# Patient Record
Sex: Female | Born: 1980 | Race: Asian | Hispanic: No | Marital: Married | Smoking: Never smoker
Health system: Southern US, Community
[De-identification: ages and names within clinical notes are randomized; demographics above are authoritative.]

## PROBLEM LIST (undated history)

## (undated) DIAGNOSIS — D649 Anemia, unspecified: Secondary | ICD-10-CM

## (undated) DIAGNOSIS — E079 Disorder of thyroid, unspecified: Secondary | ICD-10-CM

## (undated) DIAGNOSIS — E282 Polycystic ovarian syndrome: Secondary | ICD-10-CM

## (undated) HISTORY — DX: Disorder of thyroid, unspecified: E07.9

## (undated) HISTORY — DX: Polycystic ovarian syndrome: E28.2

## (undated) HISTORY — PX: GASTRIC BYPASS: SHX52

## (undated) HISTORY — DX: Anemia, unspecified: D64.9

---

## 2011-05-28 ENCOUNTER — Encounter: Payer: Self-pay | Admitting: Obstetrics & Gynecology

## 2011-07-01 ENCOUNTER — Encounter: Payer: Self-pay | Admitting: Obstetrics & Gynecology

## 2011-09-23 ENCOUNTER — Encounter: Payer: Self-pay | Admitting: Obstetrics & Gynecology

## 2011-10-28 ENCOUNTER — Encounter: Payer: Self-pay | Admitting: Obstetrics & Gynecology

## 2011-12-17 ENCOUNTER — Encounter: Payer: Self-pay | Admitting: Obstetrics & Gynecology

## 2011-12-24 ENCOUNTER — Encounter: Payer: Self-pay | Admitting: Obstetrics & Gynecology

## 2011-12-24 ENCOUNTER — Ambulatory Visit (INDEPENDENT_AMBULATORY_CARE_PROVIDER_SITE_OTHER): Payer: Self-pay | Admitting: Obstetrics & Gynecology

## 2011-12-24 VITALS — BP 121/76 | HR 105 | Temp 98.5°F | Ht 63.0 in | Wt 225.2 lb

## 2011-12-24 DIAGNOSIS — E282 Polycystic ovarian syndrome: Secondary | ICD-10-CM

## 2011-12-24 DIAGNOSIS — Z0000 Encounter for general adult medical examination without abnormal findings: Secondary | ICD-10-CM

## 2011-12-24 DIAGNOSIS — Z01419 Encounter for gynecological examination (general) (routine) without abnormal findings: Secondary | ICD-10-CM

## 2011-12-24 MED ORDER — METFORMIN HCL 1000 MG PO TABS: 1000.0000 mg | ORAL_TABLET | Freq: Two times a day (BID) | ORAL | Status: DC

## 2011-12-24 MED ORDER — MEDROXYPROGESTERONE ACETATE 10 MG PO TABS: ORAL_TABLET | ORAL | Status: DC

## 2011-12-24 NOTE — Progress Notes (Signed)
  Subjective:    Patient ID: Olivia Bowman, female    DOB: 09-05-1980, 31 y.o.   MRN: [id number]  HPI 31 yo lady who is here today with oligomenorrhea. She was diagnosed with PCOS in 2010 and started on metformin. She has hypothyroid as well and has gained 25 # in the last 3 years. She and her husband have been trying to conceive for 4 years.   Review of Systems Her pap was done about 2 years ago    Objective:   Physical Exam  Nulliparous cervix, NSSA, NT, no adnexal masses      Assessment & Plan:  PCOS and oligomenorrhea- recommend increase metformin to 1 gram po BID I will check fasting labs at her convenience. Encourage weight loss Provera 10 mg cyclicly monthly

## 2012-01-14 ENCOUNTER — Other Ambulatory Visit: Payer: Self-pay

## 2012-01-21 ENCOUNTER — Other Ambulatory Visit: Payer: Self-pay

## 2012-01-21 DIAGNOSIS — E282 Polycystic ovarian syndrome: Secondary | ICD-10-CM

## 2012-01-21 LAB — LIPID PANEL
Cholesterol: 136 mg/dL (ref 0–200)
HDL: 40 mg/dL (ref >39)
Total CHOL/HDL Ratio: 3.4 Ratio

## 2012-01-22 LAB — COMPLETE METABOLIC PANEL WITH GFR
AST: 15 U/L (ref 0–37)
Albumin: 4 g/dL (ref 3.5–5.2)
Alkaline Phosphatase: 60 U/L (ref 39–117)
Calcium: 9.3 mg/dL (ref 8.4–10.5)
Chloride: 104 mEq/L (ref 96–112)
Potassium: 4 mEq/L (ref 3.5–5.3)
Sodium: 139 mEq/L (ref 135–145)
Total Protein: 6.8 g/dL (ref 6.0–8.3)

## 2012-02-15 ENCOUNTER — Telehealth: Payer: Self-pay | Admitting: Medical

## 2012-02-15 NOTE — Telephone Encounter (Signed)
Returned call to patient. LM to return call to clinic for lab results.

## 2012-02-15 NOTE — Telephone Encounter (Signed)
Returned patient call. LM for patient to return call to clinic.

## 2012-02-15 NOTE — Telephone Encounter (Signed)
Patient returned call to clinic asking for lab results.

## 2012-02-15 NOTE — Telephone Encounter (Signed)
Patient calling requesting lab results.

## 2012-02-15 NOTE — Telephone Encounter (Signed)
Left message for patient that we are returning her call

## 2012-02-16 NOTE — Telephone Encounter (Signed)
Called and left message that this is our sixth attempt in trying to reach you.  If you continue to have any further questions, comments, or concerns to please give [place] a call back.

## 2012-03-03 ENCOUNTER — Telehealth: Payer: Self-pay | Admitting: *Deleted

## 2012-03-03 NOTE — Telephone Encounter (Signed)
Pt left message stating that we had returned her call several times regarding her lab results and she missed our calls. She is now calling again requesting the results. I called and spoke w/pt. I informed her of the tests which had been performed on 01/21/12 and the results. Pt asked if she should continue taking the synthroid. I stated that she should follow the instructions which she was given by the prescribing doctor or contact the doctor if she has questions about that medication. Pt also asked if she was supposed to take the provera if she gets a period on her own during a given month. I advised pt that she should not take the provera unless she does not have a spontaneous period every 4-6 wks. Pt voiced understanding of all information and instructions.

## 2012-06-30 ENCOUNTER — Encounter: Payer: Self-pay | Admitting: Obstetrics and Gynecology

## 2012-06-30 ENCOUNTER — Ambulatory Visit (INDEPENDENT_AMBULATORY_CARE_PROVIDER_SITE_OTHER): Payer: Self-pay | Admitting: Obstetrics and Gynecology

## 2012-06-30 VITALS — BP 113/73 | HR 91 | Temp 98.3°F | Ht 63.0 in | Wt 209.1 lb

## 2012-06-30 DIAGNOSIS — E282 Polycystic ovarian syndrome: Secondary | ICD-10-CM

## 2012-06-30 DIAGNOSIS — Z3009 Encounter for other general counseling and advice on contraception: Secondary | ICD-10-CM

## 2012-06-30 DIAGNOSIS — Z113 Encounter for screening for infections with a predominantly sexual mode of transmission: Secondary | ICD-10-CM

## 2012-06-30 LAB — CBC
MCH: 23.5 pg — ABNORMAL LOW (ref 26.0–34.0)
MCV: 71.4 fL — ABNORMAL LOW (ref 78.0–100.0)
Platelets: 427 10*3/uL — ABNORMAL HIGH (ref 150–400)
RDW: 16.8 % — ABNORMAL HIGH (ref 11.5–15.5)

## 2012-06-30 MED ORDER — LEVOTHYROXINE SODIUM 75 MCG PO TABS: 75.0000 ug | ORAL_TABLET | Freq: Every day | ORAL | Status: DC

## 2012-06-30 MED ORDER — NORGESTIMATE-ETH ESTRADIOL 0.25-35 MG-MCG PO TABS: 1.0000 | ORAL_TABLET | Freq: Every day | ORAL | Status: DC

## 2012-06-30 NOTE — Progress Notes (Signed)
Patient ID: Olivia Bowman, female   DOB: [birth year], 32 y.o.   MRN: [id number] 32 yo G0 presenting today requesting STD screening and initiation of birth control. She also would like her TSH level checked. Patient is in the process of being of obtaining legal separation from husband and desires STD testing. Patient has a primary care physician in [facility] and desires refills in her Synthroid. Informed patient that I will provide her with the refill and draw her labs but she needs to follow up with her PCP for further refill or abnormal results. Patient has never taken birth control pills and denies h/o migraine headaches, personal or family h/o DVT or strokes.  All requested laboratory test were collected Rx Sprintec provided RTC in 3 months for BP check and refills

## 2012-06-30 NOTE — Addendum Note (Signed)
Addended by: Franchot Mimes on: 06/30/2012 04:46 PM   Modules accepted: Orders

## 2012-07-01 LAB — HEPATITIS B SURFACE ANTIBODY,QUALITATIVE: Hep B S Ab: NONREACTIVE

## 2012-07-05 ENCOUNTER — Telehealth: Payer: Self-pay | Admitting: *Deleted

## 2012-07-05 NOTE — Telephone Encounter (Addendum)
Pt left message requesting lab results. I returned her call and informed her of results from all labs done on 06/30/12. Pt asked if she can get copies of her results. I stated that she may come to clinic, sign release of information and then we can give her copies. Pt voiced understanding.

## 2012-08-02 ENCOUNTER — Telehealth: Payer: Self-pay | Admitting: General Practice

## 2012-08-02 DIAGNOSIS — E282 Polycystic ovarian syndrome: Secondary | ICD-10-CM

## 2012-08-02 DIAGNOSIS — Z309 Encounter for contraceptive management, unspecified: Secondary | ICD-10-CM

## 2012-08-02 MED ORDER — NORGESTIMATE-ETH ESTRADIOL 0.25-35 MG-MCG PO TABS: 1.0000 | ORAL_TABLET | Freq: Every day | ORAL | Status: DC

## 2012-08-02 MED ORDER — LEVOTHYROXINE SODIUM 75 MCG PO TABS: 75.0000 ug | ORAL_TABLET | Freq: Every day | ORAL | Status: DC

## 2012-08-02 MED ORDER — METFORMIN HCL 1000 MG PO TABS: 1000.0000 mg | ORAL_TABLET | Freq: Two times a day (BID) | ORAL | Status: DC

## 2012-08-02 NOTE — Telephone Encounter (Signed)
Patient called and left message stating she was calling about a medication sent to [facility] but the medication is $4 at [facility] and would like the medication (birth control pills) to be sent to [facility] instead. Called patient back and informed her that I received her message about wanting her birth control pills sent to [facility] and asked patient what [facility] she wants the medication sent to. Patient stated she wants the birth control pills sent to [facility] in [place] on [address] and would also like her synthroid and metformin sent there as well. Told patient I would send them to [facility] on [address]. Patient verbalized understanding and had no further questions

## 2013-04-07 ENCOUNTER — Other Ambulatory Visit: Payer: Self-pay | Admitting: Obstetrics and Gynecology

## 2013-10-10 ENCOUNTER — Emergency Department: Payer: 59

## 2013-10-10 ENCOUNTER — Emergency Department
Admission: EM | Admit: 2013-10-10 | Discharge: 2013-10-10 | Disposition: A | Discharge status: Home / Self Care | Payer: 59 | Attending: Emergency Medicine | Admitting: Emergency Medicine

## 2013-10-10 DIAGNOSIS — E079 Disorder of thyroid, unspecified: Secondary | ICD-10-CM | POA: Insufficient documentation

## 2013-10-10 DIAGNOSIS — N289 Disorder of kidney and ureter, unspecified: Secondary | ICD-10-CM | POA: Insufficient documentation

## 2013-10-10 DIAGNOSIS — N12 Tubulo-interstitial nephritis, not specified as acute or chronic: Secondary | ICD-10-CM | POA: Insufficient documentation

## 2013-10-10 DIAGNOSIS — E282 Polycystic ovarian syndrome: Secondary | ICD-10-CM | POA: Insufficient documentation

## 2013-10-10 DIAGNOSIS — N2889 Other specified disorders of kidney and ureter: Secondary | ICD-10-CM

## 2013-10-10 LAB — CBC AND DIFFERENTIAL
Basophils Absolute Automated: 0.02 10*3/uL (ref 0.00–0.20)
Basophils Automated: 0 %
Eosinophils Absolute Automated: 0.01 10*3/uL (ref 0.00–0.70)
Eosinophils Automated: 0 %
Hematocrit: 39.8 % (ref 37.0–47.0)
Hgb: 12.9 g/dL (ref 12.0–16.0)
Immature Granulocytes Absolute: 0.04 10*3/uL
Immature Granulocytes: 0 %
Lymphocytes Absolute Automated: 1.3 10*3/uL (ref 0.50–4.40)
Lymphocytes Automated: 9 %
MCH: 26.5 pg — ABNORMAL LOW (ref 28.0–32.0)
MCHC: 32.4 g/dL (ref 32.0–36.0)
MCV: 81.7 fL (ref 80.0–100.0)
MPV: 8.6 fL — ABNORMAL LOW (ref 9.4–12.3)
Monocytes Absolute Automated: 1.64 10*3/uL — ABNORMAL HIGH (ref 0.00–1.20)
Monocytes: 12 %
Neutrophils Absolute: 10.93 10*3/uL — ABNORMAL HIGH (ref 1.80–8.10)
Neutrophils: 79 %
Platelets: 374 10*3/uL (ref 140–400)
RBC: 4.87 10*6/uL (ref 4.20–5.40)
RDW: 14 % (ref 12–15)
WBC: 13.9 10*3/uL — ABNORMAL HIGH (ref 3.50–10.80)

## 2013-10-10 LAB — COMPREHENSIVE METABOLIC PANEL
ALT: 13 U/L (ref 0–55)
AST (SGOT): 16 U/L (ref 5–34)
Albumin/Globulin Ratio: 0.9 (ref 0.9–2.2)
Albumin: 3.6 g/dL (ref 3.5–5.0)
Alkaline Phosphatase: 66 U/L (ref 40–150)
Anion Gap: 13 (ref 5.0–15.0)
BUN: 5.1 mg/dL — ABNORMAL LOW (ref 7.0–19.0)
Bilirubin, Total: 0.3 mg/dL (ref 0.2–1.2)
CO2: 21 mEq/L — ABNORMAL LOW (ref 22–29)
Calcium: 10.1 mg/dL (ref 8.5–10.5)
Chloride: 107 mEq/L (ref 98–107)
Creatinine: 0.8 mg/dL (ref 0.6–1.0)
Globulin: 4.2 g/dL — ABNORMAL HIGH (ref 2.0–3.6)
Glucose: 89 mg/dL (ref 70–100)
Potassium: 3.9 mEq/L (ref 3.5–5.1)
Protein, Total: 7.8 g/dL (ref 6.0–8.3)
Sodium: 141 mEq/L (ref 136–145)

## 2013-10-10 LAB — LACTIC ACID, PLASMA: Lactic Acid: 1 mmol/L (ref 0.7–2.1)

## 2013-10-10 LAB — URINALYSIS
Bilirubin, UA: NEGATIVE
Blood, UA: NEGATIVE
Glucose, UA: NEGATIVE
Leukocyte Esterase, UA: NEGATIVE
Nitrite, UA: NEGATIVE
Specific Gravity UA: 1.013 (ref 1.001–1.035)
Urine pH: 8.5 — AB (ref 5.0–8.0)
Urobilinogen, UA: 0.2 mg/dL (ref 0.2–2.0)

## 2013-10-10 LAB — URINE MICROSCOPIC

## 2013-10-10 LAB — LIPASE: Lipase: 20 U/L (ref 8–78)

## 2013-10-10 LAB — GFR: EGFR: >60

## 2013-10-10 LAB — URINE HCG QUALITATIVE: Urine HCG Qualitative: NEGATIVE

## 2013-10-10 MED ORDER — CIPROFLOXACIN HCL 500 MG PO TABS
500.0000 mg | ORAL_TABLET | Freq: Two times a day (BID) | ORAL | Status: AC
Start: 2013-10-10 — End: 2013-10-17

## 2013-10-10 MED ORDER — KETOROLAC TROMETHAMINE 30 MG/ML IJ SOLN
30.0000 mg | Freq: Once | INTRAMUSCULAR | Status: AC
Start: 2013-10-10 — End: 2013-10-10
  Administered 2013-10-10: 30 mg via INTRAVENOUS
  Filled 2013-10-10: qty 1

## 2013-10-10 MED ORDER — IOHEXOL 240 MG/ML IJ SOLN
50.0000 mL | Freq: Once | INTRAMUSCULAR | Status: DC
Start: 2013-10-10 — End: 2013-10-10

## 2013-10-10 MED ORDER — IODIXANOL 320 MG/ML IV SOLN
100.0000 mL | Freq: Once | INTRAVENOUS | Status: AC | PRN
Start: 2013-10-10 — End: 2013-10-10
  Administered 2013-10-10: 100 mL via INTRAVENOUS

## 2013-10-10 MED ORDER — SODIUM CHLORIDE 0.9 % IV MBP
1.0000 g | Freq: Once | INTRAVENOUS | Status: AC
Start: 2013-10-10 — End: 2013-10-10
  Administered 2013-10-10: 1 g via INTRAVENOUS
  Filled 2013-10-10: qty 1000
  Filled 2013-10-10: qty 50

## 2013-10-10 MED ORDER — OXYCODONE-ACETAMINOPHEN 5-325 MG PO TABS
2.0000 | ORAL_TABLET | Freq: Once | ORAL | Status: AC
Start: 2013-10-10 — End: 2013-10-10
  Administered 2013-10-10: 2 via ORAL
  Filled 2013-10-10: qty 2

## 2013-10-10 MED ORDER — OXYCODONE-ACETAMINOPHEN 5-325 MG PO TABS
ORAL_TABLET | ORAL | Status: DC
Start: 2013-10-10 — End: 2017-10-14

## 2013-10-10 MED ORDER — ONDANSETRON HCL 4 MG/2ML IJ SOLN
4.0000 mg | Freq: Once | INTRAMUSCULAR | Status: AC
Start: 2013-10-10 — End: 2013-10-10
  Administered 2013-10-10: 4 mg via INTRAVENOUS
  Filled 2013-10-10: qty 2

## 2013-10-10 MED ORDER — SODIUM CHLORIDE 0.9 % IV BOLUS
1000.0000 mL | Freq: Once | INTRAVENOUS | Status: AC
Start: 2013-10-10 — End: 2013-10-10
  Administered 2013-10-10: 1000 mL via INTRAVENOUS

## 2013-10-10 NOTE — ED Notes (Signed)
Patient presents to the ER with right upper abd pain and a fever since yesterday. Pain is 5/10, patient did vomit last night. Denies any pain urinating today. Patient drove herself to the ER and denies any recent travel.

## 2013-10-10 NOTE — ED Provider Notes (Signed)
ATTENDING : Helayne Seminole, MD. I HAVE PERSONALLY SEEN AND EXAMINED PATIENT. I have personally obtained a history, performed a focused PE, and participated in the medical decision making of this patient.     I have discussed and agree with the care plan of the Advanced Care Provider.    Brief Note - with pain in abdomen and fever since yesterday.  Patient states she has had some urinary frequency and urgency but denies dysuria at the present time.  She was seen at an urgent care and was found to have an elevated white count and sent in for an evaluation.  On physical examination patient has tenderness in the right upper quadrant, which is moderate in nature and also tenderness along the right flank.  She has no lower abdominal tenderness.  Plan is to check lab work, urine and sonogram of the gallbladder and reassess.          Helayne Seminole, MD  10/10/13 [phone]

## 2013-10-10 NOTE — ED Provider Notes (Signed)
Physician/Midlevel provider first contact with patient: 10/10/13 1817         History     Chief Complaint   Patient presents with   . Abdominal Pain   . Fever     HPI Comments: 33 yo female with c/o ruq abd pain, n/v and fever since yesterday. C/o urine burning several days ago, since resolved.     Patient is a 33 y.o. female presenting with abdominal pain.   Abdominal Pain  The primary symptoms of the illness do not include vaginal discharge. The current episode started yesterday. The onset of the illness was gradual. The problem has been gradually worsening.   The patient states that she believes she is currently not pregnant (denies). Additional symptoms associated with the illness include chills. Symptoms associated with the illness do not include back pain.       Past Medical History   Diagnosis Date   . Disorder of thyroid    . Polycystic ovarian disease        History reviewed. No pertinent past surgical history.    No family history on file.    Social  History   Substance Use Topics   . Smoking status: Never Smoker    . Smokeless tobacco: Not on file   . Alcohol Use: Yes      Comment: rare       .     No Known Allergies    Current/Home Medications    LEVOTHYROXINE (SYNTHROID, LEVOTHROID) 75 MCG TABLET    Take 75 mcg by mouth Once a day at 6:00am.    METFORMIN (GLUCOPHAGE) 500 MG TABLET    Take 500 mg by mouth 2 (two) times daily with meals.    TOPIRAMATE (TOPAMAX) 25 MG TABLET    Take 25 mg by mouth 2 (two) times daily.    UNKNOWN TO PATIENT            Review of Systems   Constitutional: Positive for chills.   Eyes: Negative for discharge and redness.   Cardiovascular: Negative for chest pain and palpitations.   Gastrointestinal: Negative for abdominal distention.   Genitourinary: Negative for vaginal discharge and pelvic pain.   Musculoskeletal: Negative for back pain and neck pain.   Skin: Negative for color change and wound.   Neurological: Negative for dizziness and light-headedness.       Physical Exam     BP: 126/73 mmHg, Heart Rate: 124 , Temp: 101.7 F (38.7 C), Resp Rate: 20 , SpO2: 100 %, Weight: 72.576 kg    Physical Exam   Nursing note and vitals reviewed.  Constitutional: She is oriented to person, place, and time. She appears well-developed and well-nourished. She appears distressed.   HENT:   Head: Normocephalic and atraumatic.   Eyes: Conjunctivae normal are normal. Right eye exhibits no discharge. Left eye exhibits no discharge.   Neck: Normal range of motion. Neck supple.   Cardiovascular: Regular rhythm.         tachy   Pulmonary/Chest: Effort normal and breath sounds normal. No respiratory distress.   Abdominal: Soft. Bowel sounds are normal. She exhibits no distension. There is tenderness (ruq).   Musculoskeletal: Normal range of motion.   Neurological: She is alert and oriented to person, place, and time.   Skin: Skin is warm and dry.   Psychiatric: She has a normal mood and affect. Her behavior is normal.       MDM and ED Course     ED  Medication Orders      Start     Status Ordering Provider    10/10/13 2239   oxyCODONE-acetaminophen (PERCOCET) 5-325 MG per tablet 2 tablet   Once      Comments: To go    Route: Oral  Ordered Dose: 2 tablet         Acknowledged Catricia Scheerer JEAN    10/10/13 2217   cefTRIAXone (ROCEPHIN) 1 g in sodium chloride 0.9 % 50 mL IVPB mini-bag plus   Once      Route: Intravenous  Ordered Dose: 1 g         Last MAR action:  New Bag Tetsuo Coppola JEAN    10/10/13 2021      Once,   Status:  Discontinued      Route: Oral  Ordered Dose: 50 mL         Discontinued Karlo Goeden JEAN    10/10/13 1827   ondansetron (ZOFRAN) injection 4 mg   Once      Route: Intravenous  Ordered Dose: 4 mg         Last MAR action:  Given Meher Kucinski JEAN    10/10/13 1827   ketorolac (TORADOL) injection 30 mg   Once      Route: Intravenous  Ordered Dose: 30 mg         Last MAR action:  Given Brucha Ahlquist JEAN    10/10/13 1819   sodium chloride 0.9 % bolus 1,000 mL   Once      Route: Intravenous  Ordered Dose:  1,000 mL         Last MAR action:  Stopped Alex Leahy JEAN                 MDM  Number of Diagnoses or Management Options  Pyelonephritis:   Renal mass:   Diagnosis management comments: I, Polo Riley PA-C, have been the primary provider for Benny Lennert during this Emergency Dept visit.  Oxygen saturation by pulse oximetry is 95%-100%, Normal.  Interventions: None Needed    Feeling better after meds    Results of ct d/w pt: renal masses - neoplasm vs pyelonephritis. Will tx for pyelo and d/w pt importance of close urology f/u. Recommended immediate er return for any new or worsening problems or concerns. Pt expressed understanding and agreement with [place] plan. Well appearing upon [place].     The attending signature signifies review and agreement of the history, physical examination, evaluation, clinical impression and plan except as noted  Pt seen by Dr.Virmani who is in agreement with plan.        Amount and/or Complexity of Data Reviewed  Clinical lab tests: ordered and reviewed  Tests in the radiology section of CPT: ordered and reviewed          Procedures    Clinical Impression & Disposition     Clinical Impression  Final diagnoses:   Pyelonephritis   Renal mass        ED Disposition     Discharge Kenedie Burbage discharge to home/self care.    Condition at disposition: Stable             New Prescriptions    CIPROFLOXACIN (CIPRO) 500 MG TABLET    Take 1 tablet (500 mg total) by mouth 2 (two) times daily.    OXYCODONE-ACETAMINOPHEN (PERCOCET) 5-325 MG PER TABLET    1-2 tablets by mouth every 4-6 hours as needed for pain;  Do not drive  or operate machinery while taking this medicine                 Rondell Reams, [place]  10/10/13 2248

## 2013-10-10 NOTE — Discharge Instructions (Signed)
Follow up with urology this week regarding the right renal mass versus infection  Return to the ER immediately for any new or worsening problems or concerns    Pyelonephritis    You have been diagnosed with pyelonephritis. This is also known as a kidney infection.    Pyelonephritis is a bacterial infection in the kidneys. Some symptoms are: Fever (temperature higher than 100.5F / 38C), chills, nausea (sick to the stomach) and vomiting (throwing up). Back pain on one or both sides is another symptom. There is sometimes trouble urinating (peeing). You may also feel very weak. The diagnosis is made based on your symptoms and a test of your urine.    Pyelonephritis most happens most often when a lower urinary tract infection (bladder infection) climbs up the urinary tract and gets into the kidney. It is important to treat a bladder infection early. This is to prevent pyelonephritis and possible kidney damage.    Pyelonephritis is treated with antibiotics, pain and fever medicines and fluids. Some patients need an "IV" if they have nausea (sick to the stomach) or vomiting and cannot keep down the antibiotics, or if they aren't getting better after 2-3 days of oral (by mouth) medications. Most pyelonephritis patients do not need to check into the hospital. A few patients who don't do well with the oral (by mouth) antibiotics or get sicker despite treatment may need to get admitted to the hospital.    YOU SHOULD SEEK MEDICAL ATTENTION IMMEDIATELY, EITHER HERE OR AT THE NEAREST EMERGENCY DEPARTMENT, IF ANY OF THE FOLLOWING OCCURS:   Failure to improve after 2-3 days of antibiotics.   Nausea or vomiting develops and you cannot to keep down medicines or fluids.   Increased weakness or lightheadedness.   Any progressive or worsening symptoms or any other concerns develop.         Additional Diagnosis: right renal mass

## 2013-10-10 NOTE — ED Provider Notes (Signed)
ATTENDING : Helayne Seminole, MD. I HAVE PERSONALLY SEEN AND EXAMINED PATIENT. I have personally obtained a history, performed a focused PE, and participated in the medical decision making of this patient.   REVIEW OF MLP CHART - I have reviewed the history, PE, evaluation, clinical impression, and plan and agree.            Helayne Seminole, MD  10/10/13 [phone]

## 2013-10-10 NOTE — ED Notes (Signed)
Initial lab work and blood cultures drawn by [employer] [facility] in triage.

## 2013-10-12 NOTE — ED Provider Notes (Signed)
Received lab notification of gram neg rods in blood cx. Attempted to call pt. No answer. Message left for pt to return call to ED.     Rondell Reams, [place]  10/12/13 1159

## 2013-10-17 ENCOUNTER — Encounter: Payer: Self-pay | Admitting: Physician Assistant

## 2013-10-18 ENCOUNTER — Encounter: Payer: Self-pay | Admitting: Physician Assistant

## 2013-10-18 NOTE — ED Notes (Signed)
Pt called regarding messages about lab results. Informed pt of ecoli and referred back to follow up as instructed in notes. Pt expressed understanding, states she is already in contact with urology who placed her on additional abx

## 2018-02-02 ENCOUNTER — Encounter: Payer: Self-pay | Admitting: Endocrinology

## 2018-02-18 ENCOUNTER — Ambulatory Visit: Payer: Self-pay | Admitting: Endocrinology

## 2018-03-25 ENCOUNTER — Ambulatory Visit (INDEPENDENT_AMBULATORY_CARE_PROVIDER_SITE_OTHER): Payer: 59 | Admitting: Endocrinology

## 2018-03-25 ENCOUNTER — Encounter: Payer: Self-pay | Admitting: Endocrinology

## 2018-03-25 VITALS — BP 104/72 | HR 92 | Ht 63.0 in | Wt 207.0 lb

## 2018-03-25 DIAGNOSIS — E282 Polycystic ovarian syndrome: Secondary | ICD-10-CM

## 2018-03-25 DIAGNOSIS — R635 Abnormal weight gain: Secondary | ICD-10-CM | POA: Diagnosis not present

## 2018-03-25 MED ORDER — SPIRONOLACTONE 25 MG PO TABS: 25.0000 mg | ORAL_TABLET | Freq: Every day | ORAL | 11 refills | Status: DC

## 2018-03-25 MED ORDER — DEXAMETHASONE 1 MG PO TABS: ORAL_TABLET | ORAL | 0 refills | Status: DC

## 2018-03-25 NOTE — Progress Notes (Signed)
Subjective:    Patient ID: Olivia Bowman, female    DOB: [birth year], 37 y.o.   MRN: [id number]  HPI Pt is referred by Dr Katrinka Blazing, for polycystic ovary syndrome.  Pt had menarche at age 37.  She has always had regular menses. She is G0.  She does not want a pregnancy now, but would like to preserve fertility for the future.  She started on oral contraceptives 1 month ago.  She has moderate intermitt cramps in the pelvis, and assoc depression.  She has taken metformin intermittently for many years.  Pt says PCP manages hypothyroidism.   Past Medical History:  Diagnosis Date  . Anemia   . PCOS (polycystic ovarian syndrome)   . Thyroid disease     No past surgical history on file.  Social History   Socioeconomic History  . Marital status: Married    Spouse name: Not on file  . Number of children: Not on file  . Years of education: Not on file  . Highest education level: Not on file  Occupational History  . Not on file  Social Needs  . Financial resource strain: Not on file  . Food insecurity:    Worry: Not on file    Inability: Not on file  . Transportation needs:    Medical: Not on file    Non-medical: Not on file  Tobacco Use  . Smoking status: Never Smoker  . Smokeless tobacco: Never Used  Substance and Sexual Activity  . Alcohol use: No  . Drug use: No  . Sexual activity: Yes    Birth control/protection: None  Lifestyle  . Physical activity:    Days per week: Not on file    Minutes per session: Not on file  . Stress: Not on file  Relationships  . Social connections:    Talks on phone: Not on file    Gets together: Not on file    Attends religious service: Not on file    Active member of club or organization: Not on file    Attends meetings of clubs or organizations: Not on file    Relationship status: Not on file  . Intimate partner violence:    Fear of current or ex partner: Not on file    Emotionally abused: Not on file    Physically abused: Not on file   Forced sexual activity: Not on file  Other Topics Concern  . Not on file  Social History Narrative  . Not on file    Current Outpatient Medications on File Prior to Visit  Medication Sig Dispense Refill  . fluticasone (FLONASE) 50 MCG/ACT nasal spray Place 2 sprays into both nostrils daily.    Marland Kitchen levothyroxine (SYNTHROID, LEVOTHROID) 100 MCG tablet Take 100 mcg by mouth daily.     . metFORMIN (GLUCOPHAGE) 500 MG tablet Take 500 mg by mouth 3 (three) times daily.   2  . norgestimate-ethinyl estradiol (ESTARYLLA) 0.25-35 MG-MCG tablet Take 1 tablet by mouth daily.    Marland Kitchen topiramate (TOPAMAX) 50 MG tablet Take 50 mg by mouth daily.     No current facility-administered medications on file prior to visit.     Allergies  Allergen Reactions  . Prednisone Other (See Comments)    "Pain"   Reaction to injection form. Muscle spams    Family History  Problem Relation Age of Onset  . Cancer Paternal Aunt        breast  . Diabetes Paternal Grandmother   . Polycystic ovary  syndrome Neg Hx     BP 104/72 (BP Location: Left Arm)   Pulse 92   Ht 5\' 3"  (1.6 m)   Wt 207 lb (93.9 kg)   LMP 03/11/2018   SpO2 98%   BMI 36.67 kg/m    Review of Systems Denies galactorrhea, sob, excessive sweating, diplopia, , edema, cold intolerance, numbness, voice change, and change in skin tone.  She has had laser rx, for hair growth on the face.  She has anxiety, fatigue, easy bruising, dry skin, and weight gain.       Objective:   Physical Exam VS: see vs page GEN: no distress HEAD: head: no deformity eyes: no periorbital swelling, no proptosis.   external nose and ears are normal mouth: no lesion seen NECK: supple, thyroid is not enlarged CHEST WALL: no deformity LUNGS: clear to auscultation CV: reg rate and rhythm, no murmur ABD: abdomen is soft, nontender.  no hepatosplenomegaly.  not distended.  no hernia.  MUSCULOSKELETAL: muscle bulk and strength are grossly normal.  no obvious joint  swelling.  gait is normal and steady.  EXTEMITIES: no deformity.  no ulcer on the feet.  feet are of normal color and temp.  no edema.   PULSES: dorsalis pedis intact bilat.  no carotid bruit NEURO:  cn 2-12 grossly intact.   readily moves all 4's.  sensation is intact to touch on the feet.   SKIN:  Normal texture and temperature.  No rash or suspicious lesion is visible.  Moderate terminal hair on the face.  NODES:  None palpable at the neck.   PSYCH: alert, well-oriented.  Does not appear anxious nor depressed.        Assessment & Plan:  PCO: new to me Weight gain: she should do dex supp test.  Patient Instructions  Please sign a release of information from [facility].   I have sent a prescription to your pharmacy, for another medication to control the hair growth.   You should do a "dexamethasone suppression test."  For this, you would take dexamethasone 1 mg at 10 pm (I have sent a prescription to your pharmacy), then come in for a "cortisol" blood test the next morning before 9 am.  You do not need to be fasting for this test.   Please redo the blood tests in 2 weeks. Please come back for a follow-up appointment in 6 months.   Please see a weight loss specialist.  you will receive a phone call, about a day and time for an appointment.   Please come back for a follow-up appointment in 6 months.

## 2018-03-25 NOTE — Patient Instructions (Addendum)
Please sign a release of information from [facility].   I have sent a prescription to your pharmacy, for another medication to control the hair growth.   You should do a "dexamethasone suppression test."  For this, you would take dexamethasone 1 mg at 10 pm (I have sent a prescription to your pharmacy), then come in for a "cortisol" blood test the next morning before 9 am.  You do not need to be fasting for this test.   Please redo the blood tests in 2 weeks. Please come back for a follow-up appointment in 6 months.   Please see a weight loss specialist.  you will receive a phone call, about a day and time for an appointment.   Please come back for a follow-up appointment in 6 months.

## 2018-03-28 ENCOUNTER — Telehealth: Payer: Self-pay | Admitting: Endocrinology

## 2018-03-28 NOTE — Telephone Encounter (Signed)
Per [facility] Caller states she had just left office and she noticed they sent her medication dexamethasone and spironolactone to the wrong pharmacy. It was supposed to be sent to [facility] on [address].

## 2018-03-28 NOTE — Telephone Encounter (Signed)
Called and left pt vm there are 3 [facility] on [address] need to know which one

## 2018-06-16 ENCOUNTER — Encounter (INDEPENDENT_AMBULATORY_CARE_PROVIDER_SITE_OTHER): Payer: Self-pay

## 2018-07-07 ENCOUNTER — Ambulatory Visit (INDEPENDENT_AMBULATORY_CARE_PROVIDER_SITE_OTHER): Payer: Self-pay | Admitting: Family Medicine

## 2018-07-14 ENCOUNTER — Encounter (INDEPENDENT_AMBULATORY_CARE_PROVIDER_SITE_OTHER): Payer: Self-pay

## 2018-07-21 ENCOUNTER — Ambulatory Visit (INDEPENDENT_AMBULATORY_CARE_PROVIDER_SITE_OTHER): Payer: Self-pay | Admitting: Family Medicine

## 2019-03-23 ENCOUNTER — Other Ambulatory Visit: Payer: Self-pay

## 2019-03-23 DIAGNOSIS — Z20822 Contact with and (suspected) exposure to covid-19: Secondary | ICD-10-CM

## 2019-03-24 LAB — NOVEL CORONAVIRUS, NAA: SARS-CoV-2, NAA: NOT DETECTED

## 2019-04-21 ENCOUNTER — Other Ambulatory Visit: Payer: Self-pay | Admitting: Endocrinology

## 2019-04-21 NOTE — Telephone Encounter (Signed)
Please advise 

## 2019-04-21 NOTE — Telephone Encounter (Signed)
Please forward refill request to pt's primary care provider.   

## 2019-05-15 ENCOUNTER — Telehealth: Payer: Self-pay

## 2019-05-15 NOTE — Telephone Encounter (Signed)
Per Dr. Loanne Drilling, unable to refill Spironolactone without an appt. Routing this message to the front desk for scheduling purposes.

## 2019-05-19 NOTE — Telephone Encounter (Signed)
LMTCB to schedule appointment °

## 2019-05-29 NOTE — Telephone Encounter (Signed)
Patient is scheduled for Doxy appointment on 05/30/19 at 11:00 a.m. (under quarantine-unable to come in office)

## 2019-05-30 ENCOUNTER — Ambulatory Visit (INDEPENDENT_AMBULATORY_CARE_PROVIDER_SITE_OTHER): Payer: BC Managed Care – PPO | Admitting: Endocrinology

## 2019-05-30 ENCOUNTER — Telehealth: Payer: Self-pay | Admitting: Endocrinology

## 2019-05-30 ENCOUNTER — Other Ambulatory Visit: Payer: Self-pay

## 2019-05-30 DIAGNOSIS — E282 Polycystic ovarian syndrome: Secondary | ICD-10-CM | POA: Diagnosis not present

## 2019-05-30 DIAGNOSIS — R635 Abnormal weight gain: Secondary | ICD-10-CM | POA: Diagnosis not present

## 2019-05-30 MED ORDER — NORGESTIMATE-ETH ESTRADIOL 0.25-35 MG-MCG PO TABS: 1.0000 | ORAL_TABLET | Freq: Every day | ORAL | 3 refills | Status: DC

## 2019-05-30 MED ORDER — SPIRONOLACTONE 50 MG PO TABS: 50.0000 mg | ORAL_TABLET | Freq: Every day | ORAL | 3 refills | Status: DC

## 2019-05-30 MED ORDER — DEXAMETHASONE 1 MG PO TABS: ORAL_TABLET | ORAL | 0 refills | Status: DC

## 2019-05-30 NOTE — Patient Instructions (Addendum)
I have asked for lab results from [facility].   I have sent 2 prescriptions to your pharmacy: to increase the aldactone and resume the OC's.   You should do a "dexamethasone suppression test."  For this, you would take dexamethasone 1 mg at 10 pm (I have sent a prescription to your pharmacy), then come in for a "cortisol" blood test the next morning before 9 am.  You do not need to be fasting for this test. Please redo the blood tests in 2 months.   Please call if you get swelling or pain in the calf area.   Please come back for a follow-up appointment in 6-12 months.

## 2019-05-30 NOTE — Progress Notes (Signed)
Subjective:    Patient ID: Olivia Bowman, female    DOB: 07/02/1980, 38 y.o.   MRN: [id number]  HPI telehealth visit today via doxy video visit.  Alternatives to telehealth are presented to this patient, and the patient agrees to the telehealth visit.   Pt is advised of the cost of the visit, and agrees to this, also.   Patient is at home, and I am at the office.   Persons attending the telehealth visit: the patient and I.   Pt returns for f/u of PCO (dx'ed 2010; she is G0; she does not want a pregnancy now, but would like to preserve fertility for the future; she started on oral contraceptives in 2019; she has taken metformin intermittently since 2010; PCP manages hypothyroidism).   Since on medications, facial hair is just slightly improved.  She stopped OC's 4 mos ago, due to headache.  sxs then improved but did not resolve.  She ran out of aldactone 2 weeks ago.   Past Medical History:  Diagnosis Date  . Anemia   . PCOS (polycystic ovarian syndrome)   . Thyroid disease     No past surgical history on file.  Social History   Socioeconomic History  . Marital status: Married    Spouse name: Not on file  . Number of children: Not on file  . Years of education: Not on file  . Highest education level: Not on file  Occupational History  . Not on file  Social Needs  . Financial resource strain: Not on file  . Food insecurity    Worry: Not on file    Inability: Not on file  . Transportation needs    Medical: Not on file    Non-medical: Not on file  Tobacco Use  . Smoking status: Never Smoker  . Smokeless tobacco: Never Used  Substance and Sexual Activity  . Alcohol use: No  . Drug use: No  . Sexual activity: Yes    Birth control/protection: None  Lifestyle  . Physical activity    Days per week: Not on file    Minutes per session: Not on file  . Stress: Not on file  Relationships  . Social Musician on phone: Not on file    Gets together: Not on file   Attends religious service: Not on file    Active member of club or organization: Not on file    Attends meetings of clubs or organizations: Not on file    Relationship status: Not on file  . Intimate partner violence    Fear of current or ex partner: Not on file    Emotionally abused: Not on file    Physically abused: Not on file    Forced sexual activity: Not on file  Other Topics Concern  . Not on file  Social History Narrative  . Not on file    Current Outpatient Medications on File Prior to Visit  Medication Sig Dispense Refill  . fluticasone (FLONASE) 50 MCG/ACT nasal spray Place 2 sprays into both nostrils daily.    Marland Kitchen levothyroxine (SYNTHROID, LEVOTHROID) 100 MCG tablet Take 100 mcg by mouth daily.     . metFORMIN (GLUCOPHAGE) 500 MG tablet Take 500 mg by mouth 3 (three) times daily.   2  . topiramate (TOPAMAX) 50 MG tablet Take 50 mg by mouth daily.     No current facility-administered medications on file prior to visit.     Allergies  Allergen Reactions  .  Prednisone Other (See Comments)    "Pain"   Reaction to injection form. Muscle spams    Family History  Problem Relation Age of Onset  . Cancer Paternal Aunt        breast  . Diabetes Paternal Grandmother   . Polycystic ovary syndrome Neg Hx     There were no vitals taken for this visit.   Review of Systems She has weight gain.      Objective:   Physical Exam      Assessment & Plan:  PCO: clinically not improving Obesity: worse  Patient Instructions  I have asked for lab results from [facility].   I have sent 2 prescriptions to your pharmacy: to increase the aldactone and resume the OC's.   You should do a "dexamethasone suppression test."  For this, you would take dexamethasone 1 mg at 10 pm (I have sent a prescription to your pharmacy), then come in for a "cortisol" blood test the next morning before 9 am.  You do not need to be fasting for this test. Please redo the blood tests in 2 months.    Please call if you get swelling or pain in the calf area.   Please come back for a follow-up appointment in 6-12 months.

## 2019-05-30 NOTE — Telephone Encounter (Signed)
please contact [facility].  Can we have 202 lab results for mutual patient? Thanks.

## 2019-05-31 ENCOUNTER — Other Ambulatory Visit: Payer: Self-pay | Admitting: Orthopedic Surgery

## 2019-05-31 NOTE — Telephone Encounter (Signed)
Please clarify dates.

## 2019-05-31 NOTE — Telephone Encounter (Signed)
Attempted to call [facility] to obtain lab results. Phone continued to ring repeatedly and with no answer. Will continue efforts to reach office to obtain labs.

## 2019-05-31 NOTE — Telephone Encounter (Signed)
cn't we have all 2020 lab results

## 2019-06-01 NOTE — Telephone Encounter (Signed)
Attempted to call [facility] to obtain lab results. Staff picked up phone, then placed me on hold for 10 minutes. Called pt to ask that she assist in our efforts to obtain these records. Unable to reach pt d/t VM being full. Will continue efforts.

## 2019-06-01 NOTE — Telephone Encounter (Signed)
FINAL ATTEMPT:  [facility] to obtain records. Was placed on hold x5 minutes, call was then picked up, transferred, then disconnected. Attempted to call pt but still unable to reach d/t VM being full.

## 2019-08-28 ENCOUNTER — Other Ambulatory Visit: Payer: Self-pay

## 2019-08-28 DIAGNOSIS — R635 Abnormal weight gain: Secondary | ICD-10-CM

## 2019-08-28 MED ORDER — METFORMIN HCL 500 MG PO TABS: 500.0000 mg | ORAL_TABLET | Freq: Three times a day (TID) | ORAL | 2 refills | Status: AC

## 2020-08-09 ENCOUNTER — Other Ambulatory Visit: Payer: Self-pay

## 2020-08-09 ENCOUNTER — Emergency Department (HOSPITAL_BASED_OUTPATIENT_CLINIC_OR_DEPARTMENT_OTHER)
Admission: EM | Admit: 2020-08-09 | Discharge: 2020-08-09 | Disposition: A | Discharge status: Home / Self Care | Payer: BC Managed Care – PPO | Attending: Emergency Medicine | Admitting: Emergency Medicine

## 2020-08-09 ENCOUNTER — Emergency Department (HOSPITAL_BASED_OUTPATIENT_CLINIC_OR_DEPARTMENT_OTHER): Payer: BC Managed Care – PPO

## 2020-08-09 DIAGNOSIS — U071 COVID-19: Secondary | ICD-10-CM | POA: Insufficient documentation

## 2020-08-09 DIAGNOSIS — J029 Acute pharyngitis, unspecified: Secondary | ICD-10-CM

## 2020-08-09 DIAGNOSIS — H9209 Otalgia, unspecified ear: Secondary | ICD-10-CM | POA: Diagnosis not present

## 2020-08-09 DIAGNOSIS — R0602 Shortness of breath: Secondary | ICD-10-CM | POA: Diagnosis present

## 2020-08-09 LAB — GROUP A STREP BY PCR: Group A Strep by PCR: NOT DETECTED

## 2020-08-09 LAB — SARS CORONAVIRUS 2 (TAT 6-24 HRS): SARS Coronavirus 2: POSITIVE — AB

## 2020-08-09 MED ORDER — HYDROCOD POLST-CPM POLST ER 10-8 MG/5ML PO SUER: 5.0000 mL | Freq: Two times a day (BID) | ORAL | 0 refills | Status: DC | PRN

## 2020-08-09 MED ORDER — HYDROCODONE-HOMATROPINE 5-1.5 MG/5ML PO SYRP: 5.0000 mL | ORAL_SOLUTION | Freq: Once | ORAL | Status: DC

## 2020-08-09 MED ORDER — LIDOCAINE VISCOUS HCL 2 % MT SOLN
15.0000 mL | Freq: Once | OROMUCOSAL | Status: AC
  Administered 2020-08-09: 15 mL via OROMUCOSAL
  Filled 2020-08-09: qty 15

## 2020-08-09 MED ORDER — HYDROCOD POLST-CPM POLST ER 10-8 MG/5ML PO SUER
5.0000 mL | Freq: Once | ORAL | Status: AC
  Administered 2020-08-09: 5 mL via ORAL
  Filled 2020-08-09: qty 5

## 2020-08-09 MED ORDER — DEXAMETHASONE SODIUM PHOSPHATE 10 MG/ML IJ SOLN
10.0000 mg | Freq: Once | INTRAMUSCULAR | Status: AC
  Administered 2020-08-09: 10 mg
  Filled 2020-08-09: qty 1

## 2020-08-09 MED ORDER — IBUPROFEN 800 MG PO TABS
800.0000 mg | ORAL_TABLET | Freq: Once | ORAL | Status: AC
  Administered 2020-08-09: 800 mg via ORAL
  Filled 2020-08-09: qty 1

## 2020-08-09 NOTE — ED Notes (Signed)
Pt sleeping on stretcher.  Still feeling very sleepy and has not found a ride home.

## 2020-08-09 NOTE — ED Triage Notes (Signed)
Pt states had dry cough last night, had fever today of 101 and took tylenol. Took home Covid test and was negative. Shortness of breath and sore throat stared at 9 on 2-10 and has gotten worse.

## 2020-08-09 NOTE — ED Provider Notes (Signed)
[facility] EMERGENCY DEPARTMENT Provider Note   CSN: [id number] Arrival date & time: 08/09/20  0230     History Chief Complaint  Patient presents with  . Shortness of Breath  . Sore Throat    Olivia Bowman is a 40 y.o. female.   Shortness of Breath Severity:  Mild Onset quality:  Gradual Duration:  1 day Timing:  Constant Progression:  Worsening Context: not activity   Associated symptoms: cough, ear pain, fever, headaches and sore throat   Associated symptoms: no neck pain   Sore Throat Associated symptoms include headaches and shortness of breath.  URI Presenting symptoms: congestion, cough, ear pain, facial pain, fatigue, fever, rhinorrhea and sore throat   Severity:  Mild Onset quality:  Gradual Timing:  Constant Progression:  Worsening Ineffective treatments:  None tried Associated symptoms: headaches, myalgias and sinus pain   Associated symptoms: no arthralgias and no neck pain        Past Medical History:  Diagnosis Date  . Anemia   . PCOS (polycystic ovarian syndrome)   . Thyroid disease     Patient Active Problem List   Diagnosis Date Noted  . Weight gain 03/25/2018  . PCOS (polycystic ovarian syndrome) 06/30/2012    No past surgical history on file.   OB History    Gravida  0   Para  0   Term  0   Preterm  0   AB  0   Living  0     SAB  0   IAB  0   Ectopic  0   Multiple  0   Live Births              Family History  Problem Relation Age of Onset  . Cancer Paternal Aunt        breast  . Diabetes Paternal Grandmother   . Polycystic ovary syndrome Neg Hx     Social History   Tobacco Use  . Smoking status: Never Smoker  . Smokeless tobacco: Never Used  Substance Use Topics  . Alcohol use: No  . Drug use: No    Home Medications Prior to Admission medications   Medication Sig Start Date End Date Taking? Authorizing Provider  dexamethasone (DECADRON) 1 MG tablet Take at 9-10 PM, the night before  blood test 05/30/19   Romero Belling, MD  fluticasone [facility]) 50 MCG/ACT nasal spray Place 2 sprays into both nostrils daily.    [provider]  levothyroxine (SYNTHROID, LEVOTHROID) 100 MCG tablet Take 100 mcg by mouth daily.  11/19/17   [provider]  metFORMIN (GLUCOPHAGE) 500 MG tablet Take 1 tablet (500 mg total) by mouth 3 (three) times daily. 08/28/19   Romero Belling, MD  norgestimate-ethinyl estradiol (ESTARYLLA) 0.25-35 MG-MCG tablet Take 1 tablet by mouth daily. 05/30/19   Romero Belling, MD  QSYMIA 7.5-46 MG CP24 Take 1 capsule by mouth daily. 07/18/20   [provider]  spironolactone (ALDACTONE) 50 MG tablet Take 1 tablet (50 mg total) by mouth daily. 05/30/19   Romero Belling, MD  topiramate (TOPAMAX) 50 MG tablet Take 50 mg by mouth daily.    [provider]    Allergies    Prednisone  Review of Systems   Review of Systems  Constitutional: Positive for fatigue and fever.  HENT: Positive for congestion, ear pain, rhinorrhea, sinus pain and sore throat.   Respiratory: Positive for cough and shortness of breath.   Musculoskeletal: Positive for myalgias. Negative for arthralgias  and neck pain.  Neurological: Positive for headaches.  All other systems reviewed and are negative.   Physical Exam Updated Vital Signs BP 127/65 (BP Location: Right Arm)   Pulse 86   Temp 98 F (36.7 C) (Oral)   Resp 20   Ht 5\' 3"  (1.6 m)   Wt 90.7 kg   SpO2 97%   BMI 35.43 kg/m   Physical Exam Vitals and nursing note reviewed.  Constitutional:      Appearance: She is well-developed and well-nourished.  HENT:     Head: Normocephalic and atraumatic.  Cardiovascular:     Rate and Rhythm: Normal rate and regular rhythm.  Pulmonary:     Effort: No respiratory distress.     Breath sounds: No stridor.  Abdominal:     General: There is no distension.  Musculoskeletal:     Cervical back: Normal range of motion.  Neurological:     Mental Status: She is  alert.     ED Results / Procedures / Treatments   Labs (all labs ordered are listed, but only abnormal results are displayed) Labs Reviewed  GROUP A STREP BY PCR  SARS CORONAVIRUS 2 (TAT 6-24 HRS)    EKG None  Radiology DG Neck Soft Tissue  Result Date: 08/09/2020 CLINICAL DATA:  Cough and fever.  Shortness of breath.  Sore throat. EXAM: NECK SOFT TISSUES - 1+ VIEW COMPARISON:  None. FINDINGS: Soft tissue and air shadows appear within normal limits. No sign of retropharyngeal thickening. Epiglottis within normal limits. Bony structures appear normal. IMPRESSION: Negative.  No inflammatory changes by radiography. Electronically Signed   By: 10/07/2020 M.D.   On: 08/09/2020 03:33    Procedures Procedures   Medications Ordered in ED Medications  dexamethasone (DECADRON) injection 10 mg (10 mg Other Given 08/09/20 0309)  ibuprofen (ADVIL) tablet 800 mg (800 mg Oral Given 08/09/20 0310)  lidocaine (XYLOCAINE) 2 % viscous mouth solution 15 mL (15 mLs Mouth/Throat Given 08/09/20 0308)  chlorpheniramine-HYDROcodone (TUSSIONEX) 10-8 MG/5ML suspension 5 mL (5 mLs Oral Given 08/09/20 0423)    ED Course  I have reviewed the triage vital signs and the nursing notes.  Pertinent labs & imaging results that were available during my care of the patient were reviewed by me and considered in my medical decision making (see chart for details).    MDM Rules/Calculators/A&P                          xr without obvious RPA or severe epiglotittis. Symptoms improved. Strep negative. covid pending. VSS. Stable for discharge.  Final Clinical Impression(s) / ED Diagnoses Final diagnoses:  None    Rx / DC Orders ED Discharge Orders    None       Lesly Pontarelli, 10/07/20, MD 08/09/20 [phone]

## 2020-08-10 ENCOUNTER — Telehealth: Payer: Self-pay | Admitting: Unknown Physician Specialty

## 2020-08-10 ENCOUNTER — Telehealth: Payer: Self-pay | Admitting: *Deleted

## 2020-08-10 NOTE — Telephone Encounter (Signed)
Called to discuss with patient about COVID-19 symptoms and the use of one of the available treatments for those with mild to moderate Covid symptoms and at a high risk of hospitalization.  Pt appears to qualify for outpatient treatment due to co-morbid conditions and/or a member of an at-risk group in accordance with the FDA Emergency Use Authorization.    Symptom onset: 2/10 Vaccinated: ? Booster?  Qualifiers: Obesity  Unable to reach pt - Mailbox is full  [employer]

## 2020-08-10 NOTE — Telephone Encounter (Signed)
Patient notified of positive COVID-19 test results. Pt verbalized understanding. Pt reports symptoms of Fever, headache, chills and coughing so went to ED. Criteria for self-isolation if you test positive for COVID-19, regardless of vaccination status:  -If you have mild symptoms that are resolving or have resolved, isolate at home for 5 days since symptoms started AND continue to wear a well-fitted mask when around others in the home and in public for 5 additional days after isolation is completed -If you have a fever and/or moderate to severe symptoms, isolate for at least 10 days since the symptoms started AND until you are fever free for at least 24 hours without the use of fever-reducing medications -If you tested positive and did not have symptoms, isolate for at least 5 days after your positive test  Use over-the-counter medications for symptoms.If you develop respiratory issues/distress, seek medical care in the Emergency Department.  If you must leave home or if you have to be around others please wear a mask. Please limit contact with immediate family members in the home, practice social distancing, frequent handwashing and clean hard surfaces touched frequently with household cleaning products. Members of your household will also need to quarantine and test.You may also be contacted by the health department for follow up. [facility] Department notified.

## 2020-09-03 ENCOUNTER — Telehealth: Payer: Self-pay | Admitting: Endocrinology

## 2020-09-03 NOTE — Telephone Encounter (Signed)
Please contact pt to schedule F/U appt before any medication is refilled. Pt has not been seen sine 11/20.  Thank you,  Christy Gentles

## 2020-09-03 NOTE — Telephone Encounter (Signed)
MEDICATION: levothyroxine and metformin  PHARMACY:   [facility] [address] [place], [place] - [address] Phone:  [phone]  Fax:  [phone]      HAS THE PATIENT CONTACTED THEIR PHARMACY?  no  IS THIS A 90 DAY SUPPLY : yes  IS PATIENT OUT OF MEDICATION: yes  IF NOT; HOW MUCH IS LEFT:   LAST APPOINTMENT DATE: @12 /06/2018  NEXT APPOINTMENT DATE:@04 /09/2020  DO WE HAVE YOUR PERMISSION TO LEAVE A DETAILED MESSAGE?: yes  OTHER COMMENTS:  Thyroid was checked in July   **Let patient know to contact pharmacy at the end of the day to make sure medication is ready. **  ** Please notify patient to allow 48-72 hours to process**  **Encourage patient to contact the pharmacy for refills or they can request refills through [facility]**

## 2020-09-04 ENCOUNTER — Other Ambulatory Visit: Payer: Self-pay

## 2020-09-04 DIAGNOSIS — E039 Hypothyroidism, unspecified: Secondary | ICD-10-CM

## 2020-09-04 MED ORDER — LEVOTHYROXINE SODIUM 100 MCG PO TABS: 100.0000 ug | ORAL_TABLET | Freq: Every day | ORAL | 0 refills | Status: DC

## 2020-09-04 NOTE — Telephone Encounter (Signed)
Appt set for 09/30/20

## 2020-09-04 NOTE — Telephone Encounter (Signed)
Please refill x 1  

## 2020-09-04 NOTE — Telephone Encounter (Signed)
Is it ok to fill levothyroxine? Pt is now scheduled for 4/4. Pt had not been seen since 11/20 and not sure if the dame dosage is needed. The Metformin has already been filled by someone else.  Please Advise

## 2020-09-06 ENCOUNTER — Other Ambulatory Visit: Payer: Self-pay

## 2020-09-06 DIAGNOSIS — E039 Hypothyroidism, unspecified: Secondary | ICD-10-CM

## 2020-09-06 MED ORDER — LEVOTHYROXINE SODIUM 100 MCG PO TABS: 100.0000 ug | ORAL_TABLET | Freq: Every day | ORAL | 0 refills | Status: AC

## 2020-09-06 NOTE — Telephone Encounter (Signed)
Pt called back regarding note below

## 2020-09-06 NOTE — Telephone Encounter (Signed)
Pt called to advise the medication was sent to the wrong pharmacy. She ask that we resend the prescription to the pharmacy below because now she is completely out of the medication and this pharmacy is open 24 hours.   [id number] - [place], [place] - [address] AT [facility] [address] Phone:  [phone]  Fax:  [phone]

## 2020-09-30 ENCOUNTER — Ambulatory Visit: Payer: BC Managed Care – PPO | Admitting: Endocrinology

## 2021-06-04 ENCOUNTER — Other Ambulatory Visit: Payer: Self-pay

## 2021-06-04 ENCOUNTER — Emergency Department (HOSPITAL_BASED_OUTPATIENT_CLINIC_OR_DEPARTMENT_OTHER)
Admission: EM | Admit: 2021-06-04 | Discharge: 2021-06-04 | Disposition: A | Discharge status: Home / Self Care | Payer: BC Managed Care – PPO | Attending: Emergency Medicine | Admitting: Emergency Medicine

## 2021-06-04 ENCOUNTER — Encounter (HOSPITAL_BASED_OUTPATIENT_CLINIC_OR_DEPARTMENT_OTHER): Payer: Self-pay

## 2021-06-04 DIAGNOSIS — J029 Acute pharyngitis, unspecified: Secondary | ICD-10-CM

## 2021-06-04 DIAGNOSIS — Z20822 Contact with and (suspected) exposure to covid-19: Secondary | ICD-10-CM | POA: Diagnosis not present

## 2021-06-04 DIAGNOSIS — R197 Diarrhea, unspecified: Secondary | ICD-10-CM | POA: Diagnosis not present

## 2021-06-04 LAB — RESP PANEL BY RT-PCR (FLU A&B, COVID) ARPGX2
Influenza A by PCR: NEGATIVE
Influenza B by PCR: NEGATIVE
SARS Coronavirus 2 by RT PCR: NEGATIVE

## 2021-06-04 LAB — GROUP A STREP BY PCR: Group A Strep by PCR: NOT DETECTED

## 2021-06-04 MED ORDER — LIDOCAINE VISCOUS HCL 2 % MT SOLN: OROMUCOSAL | 0 refills | Status: AC

## 2021-06-04 MED ORDER — LIDOCAINE VISCOUS HCL 2 % MT SOLN
15.0000 mL | Freq: Once | OROMUCOSAL | Status: AC
  Administered 2021-06-04: 15 mL via OROMUCOSAL
  Filled 2021-06-04: qty 15

## 2021-06-04 MED ORDER — DEXAMETHASONE SODIUM PHOSPHATE 10 MG/ML IJ SOLN
10.0000 mg | Freq: Once | INTRAMUSCULAR | Status: AC
  Administered 2021-06-04: 10 mg via INTRAMUSCULAR
  Filled 2021-06-04: qty 1

## 2021-06-04 NOTE — ED Provider Notes (Signed)
[facility] Provider Note: Lowella Dell, MD, FACEP  CSN: [id number] MRN: [id number] ARRIVAL: 06/04/21 at 0021 ROOM: MH11/[facility]11   CHIEF COMPLAINT  Sore Throat   HISTORY OF PRESENT ILLNESS  06/04/21 12:27 AM Olivia Bowman is a 40 y.o. female with sore throat since the day before yesterday, worse with swallowing. Pain is 9 out of 10. Associated with nonproductive cough, chest soreness with coughing, and shortness of breath. Diarrhea but no nausea or vomiting. No documented fever. She had taken Mucinex-D without relief.   Past Medical History:  Diagnosis Date   Anemia    PCOS (polycystic ovarian syndrome)    Thyroid disease     History reviewed. No pertinent surgical history.  Family History  Problem Relation Age of Onset   Cancer Paternal Aunt        breast   Diabetes Paternal Grandmother    Polycystic ovary syndrome Neg Hx     Social History   Tobacco Use   Smoking status: Never   Smokeless tobacco: Never  Substance Use Topics   Alcohol use: No   Drug use: No    Prior to Admission medications   Medication Sig Start Date End Date Taking? Authorizing Provider  lidocaine (XYLOCAINE) 2 % solution Gargle and spit out 14mL every four hours as needed for sore throat. 06/04/21  Yes Secily Walthour, MD  fluticasone (FLONASE) 50 MCG/ACT nasal spray Place 2 sprays into both nostrils daily.    [provider]  levothyroxine (SYNTHROID) 100 MCG tablet Take 1 tablet (100 mcg total) by mouth daily. 09/06/20   Romero Belling, MD  metFORMIN (GLUCOPHAGE) 500 MG tablet Take 1 tablet (500 mg total) by mouth 3 (three) times daily. 08/28/19   Romero Belling, MD  QSYMIA 7.5-46 MG CP24 Take 1 capsule by mouth daily. 07/18/20   [provider]  norgestimate-ethinyl estradiol (ESTARYLLA) 0.25-35 MG-MCG tablet Take 1 tablet by mouth daily. 05/30/19 08/09/20  Romero Belling, MD  spironolactone (ALDACTONE) 50 MG tablet Take 1 tablet (50 mg total) by mouth daily. 05/30/19  08/09/20  Romero Belling, MD  topiramate (TOPAMAX) 50 MG tablet Take 50 mg by mouth daily.  08/09/20  [provider]    Allergies Prednisone   REVIEW OF SYSTEMS  Negative except as noted here or in the History of Present Illness.   PHYSICAL EXAMINATION  Initial Vital Signs Blood pressure 135/80, pulse (!) 101, temperature 98.1 F (36.7 C), temperature source Oral, resp. rate 18, height 5\' 3"  (1.6 m), weight 95.3 kg, last menstrual period 05/06/2021, SpO2 100 %.  Examination General: Well-developed, well-nourished female in no acute distress; appearance consistent with age of record HENT: normocephalic; atraumatic; no phayngeal erythema or exudate Eyes: normal appearance Neck: supple Heart: regular rate and rhythm Lungs: clear to auscultation bilaterally Abdomen: soft; nondistended; nontender; owel sounds present Extremities: No deformity; full range of motion Neurologic: Awake, alert and oriented; motor function intact in all extremities and symmetric; no facial droop Skin: Warm and dry Psychiatric: Normal mood and affect   RESULTS  Summary of this visit's results, reviewed and interpreted by myself:   EKG Interpretation  Date/Time:    Ventricular Rate:    PR Interval:    QRS Duration:   QT Interval:    QTC Calculation:   R Axis:     Text Interpretation:         Laboratory Studies: Results for orders placed or performed during the hospital encounter of 06/04/21 (from the past 24 hour(s))  Resp  Panel by RT-PCR (Flu A&B, Covid) Nasopharyngeal Swab     Status: None   Collection Time: 06/04/21 12:27 AM   Specimen: Nasopharyngeal Swab; Nasopharyngeal(NP) swabs in vial transport medium  Result Value Ref Range   SARS Coronavirus 2 by RT PCR NEGATIVE NEGATIVE   Influenza A by PCR NEGATIVE NEGATIVE   Influenza B by PCR NEGATIVE NEGATIVE  Group A Strep by PCR     Status: None   Collection Time: 06/04/21 12:35 AM   Specimen: Throat; Sterile Swab  Result Value  Ref Range   Group A Strep by PCR NOT DETECTED NOT DETECTED   Imaging Studies: No results found.  ED COURSE and MDM  Nursing notes, initial and subsequent vitals signs, including pulse oximetry, reviewed and interpreted by myself.  Vitals:   06/04/21 0026 06/04/21 0027  BP: 135/80   Pulse: (!) 101   Resp: 18   Temp: 98.1 F (36.7 C)   TempSrc: Oral   SpO2: 100%   Weight:  95.3 kg  Height:  5\' 3"  (1.6 m)   Medications  dexamethasone (DECADRON) injection 10 mg (has no administration in time range)  lidocaine (XYLOCAINE) 2 % viscous mouth solution 15 mL (has no administration in time range)   Dexamethasone given to relieve throat inflammation. Viscous lidocaine prescribed.   PROCEDURES  Procedures   ED DIAGNOSES     ICD-10-CM   1. Viral pharyngitis  J02.9          Caio Devera, MD 06/04/21 0120

## 2021-06-04 NOTE — ED Triage Notes (Signed)
Pt reports sore throat beginning yesterday. Today developed nonproductive cough, shob on exertion and pain when swallowing.

## 2022-02-24 ENCOUNTER — Other Ambulatory Visit
Admission: RE | Admit: 2022-02-24 | Discharge: 2022-02-24 | Disposition: A | Discharge status: Home / Self Care | Payer: BC Managed Care – PPO | Source: Ambulatory Visit | Attending: General Surgery | Admitting: General Surgery

## 2022-02-24 DIAGNOSIS — K9589 Other complications of other bariatric procedure: Secondary | ICD-10-CM | POA: Diagnosis present

## 2022-02-24 DIAGNOSIS — R1110 Vomiting, unspecified: Secondary | ICD-10-CM | POA: Insufficient documentation

## 2022-02-24 LAB — CBC WITH DIFFERENTIAL/PLATELET
Abs Immature Granulocytes: 0.04 10*3/uL (ref 0.00–0.07)
Basophils Absolute: 0 10*3/uL (ref 0.0–0.1)
Basophils Relative: 0 %
Eosinophils Absolute: 0.3 10*3/uL (ref 0.0–0.5)
Eosinophils Relative: 4 %
HCT: 34.4 % — ABNORMAL LOW (ref 36.0–46.0)
Hemoglobin: 9.7 g/dL — ABNORMAL LOW (ref 12.0–15.0)
Immature Granulocytes: 0 %
Lymphocytes Relative: 28 %
Lymphs Abs: 2.5 10*3/uL (ref 0.7–4.0)
MCH: 23.2 pg — ABNORMAL LOW (ref 26.0–34.0)
MCHC: 28.2 g/dL — ABNORMAL LOW (ref 30.0–36.0)
MCV: 82.3 fL (ref 80.0–100.0)
Monocytes Absolute: 0.6 10*3/uL (ref 0.1–1.0)
Monocytes Relative: 6 %
Neutro Abs: 5.6 10*3/uL (ref 1.7–7.7)
Neutrophils Relative %: 62 %
Platelets: 442 10*3/uL — ABNORMAL HIGH (ref 150–400)
RBC: 4.18 MIL/uL (ref 3.87–5.11)
RDW: 17 % — ABNORMAL HIGH (ref 11.5–15.5)
WBC: 9.1 10*3/uL (ref 4.0–10.5)
nRBC: 0 % (ref 0.0–0.2)

## 2022-02-24 LAB — COMPREHENSIVE METABOLIC PANEL
ALT: 41 U/L (ref 0–44)
AST: 28 U/L (ref 15–41)
Albumin: 3.2 g/dL — ABNORMAL LOW (ref 3.5–5.0)
Alkaline Phosphatase: 84 U/L (ref 38–126)
Anion gap: 9 (ref 5–15)
BUN: 21 mg/dL — ABNORMAL HIGH (ref 6–20)
CO2: 26 mmol/L (ref 22–32)
Calcium: 9.3 mg/dL (ref 8.9–10.3)
Chloride: 106 mmol/L (ref 98–111)
Creatinine, Ser: 0.65 mg/dL (ref 0.44–1.00)
GFR, Estimated: >60 mL/min (ref >60)
Glucose, Bld: 74 mg/dL (ref 70–99)
Potassium: 4.4 mmol/L (ref 3.5–5.1)
Sodium: 141 mmol/L (ref 135–145)
Total Bilirubin: 0.4 mg/dL (ref 0.3–1.2)
Total Protein: 6.5 g/dL (ref 6.5–8.1)

## 2022-02-24 LAB — MAGNESIUM: Magnesium: 2.1 mg/dL (ref 1.7–2.4)

## 2022-02-24 LAB — PHOSPHORUS: Phosphorus: 5.3 mg/dL — ABNORMAL HIGH (ref 2.5–4.6)

## 2022-02-24 LAB — TRIGLYCERIDES: Triglycerides: 87 mg/dL (ref <150)

## 2022-05-31 IMAGING — CR DG NECK SOFT TISSUE
2 series · 2 of 2 positions shown · non-contrast
Comparison: None.

CLINICAL DATA: Cough and fever.  Shortness of breath.  Sore throat.

EXAM:
NECK SOFT TISSUES - 1+ VIEW

[w soft tissue neck *]
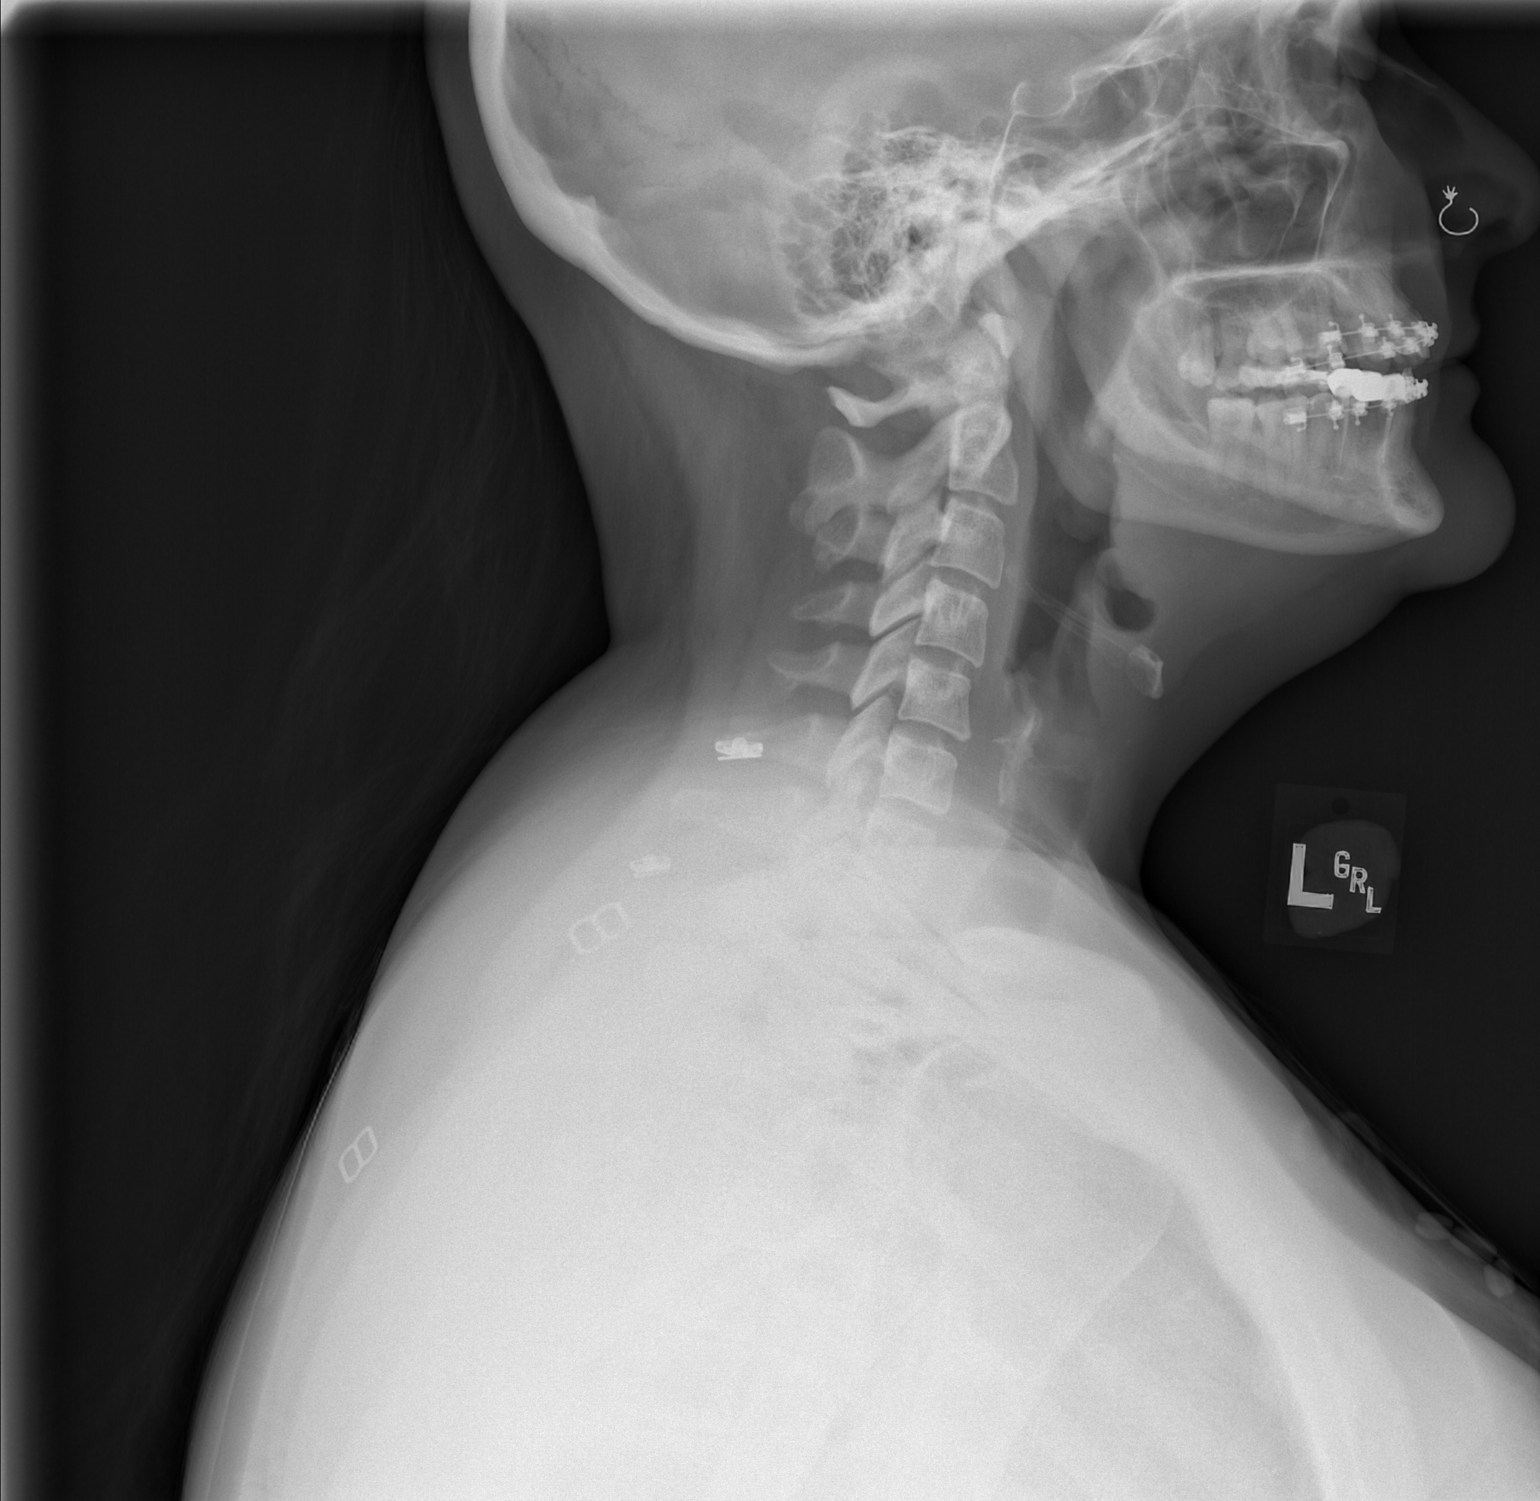

[w soft tissue neck ap]
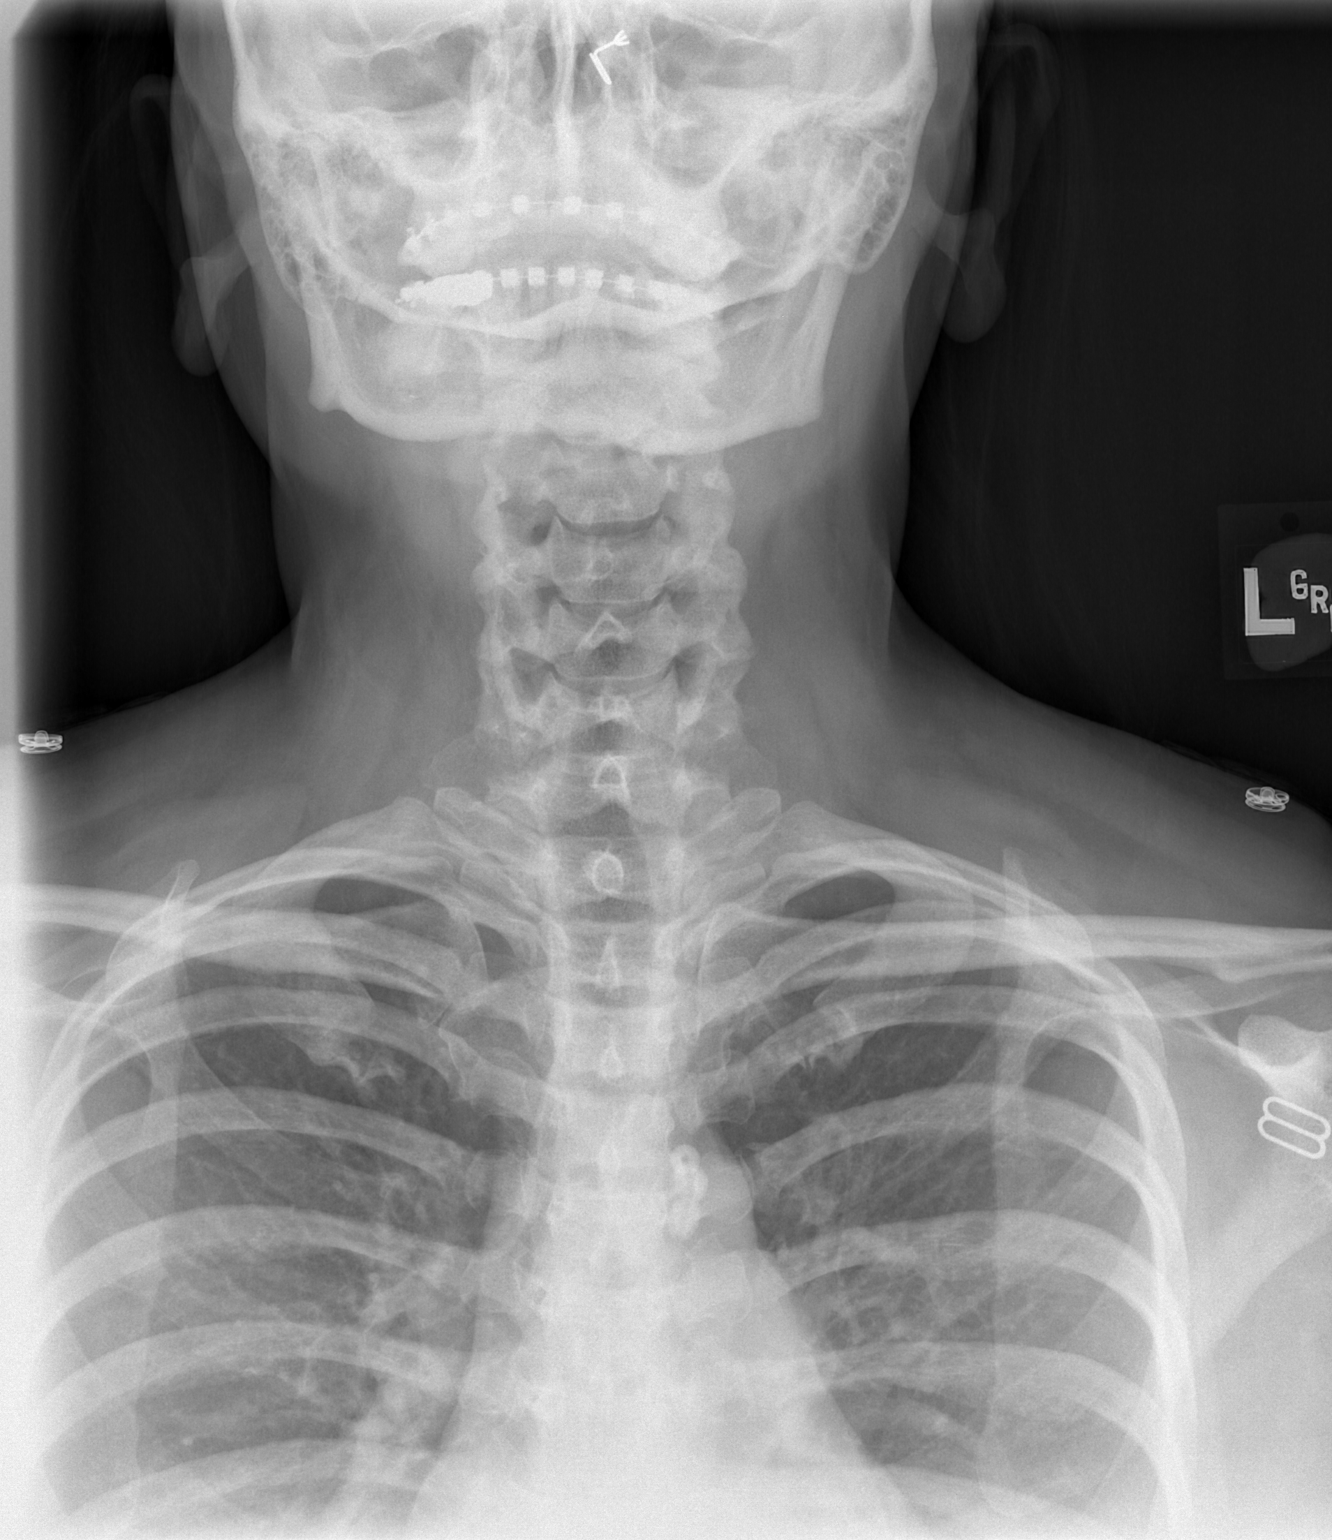

[2 of 2 positions shown; findings below may reference images not displayed]

FINDINGS: Soft tissue and air shadows appear within normal limits. No sign of
retropharyngeal thickening. Epiglottis within normal limits. Bony
structures appear normal.
IMPRESSION: Negative.  No inflammatory changes by radiography.

## 2023-03-18 ENCOUNTER — Other Ambulatory Visit: Payer: Self-pay

## 2023-03-18 ENCOUNTER — Emergency Department (HOSPITAL_BASED_OUTPATIENT_CLINIC_OR_DEPARTMENT_OTHER)
Admission: EM | Admit: 2023-03-18 | Discharge: 2023-03-18 | Disposition: A | Discharge status: Home / Self Care | Payer: Managed Care, Other (non HMO) | Attending: Emergency Medicine | Admitting: Emergency Medicine

## 2023-03-18 ENCOUNTER — Emergency Department (HOSPITAL_BASED_OUTPATIENT_CLINIC_OR_DEPARTMENT_OTHER): Payer: Managed Care, Other (non HMO)

## 2023-03-18 ENCOUNTER — Encounter (HOSPITAL_BASED_OUTPATIENT_CLINIC_OR_DEPARTMENT_OTHER): Payer: Self-pay

## 2023-03-18 DIAGNOSIS — M7918 Myalgia, other site: Secondary | ICD-10-CM

## 2023-03-18 DIAGNOSIS — Y92410 Unspecified street and highway as the place of occurrence of the external cause: Secondary | ICD-10-CM | POA: Diagnosis not present

## 2023-03-18 DIAGNOSIS — M791 Myalgia, unspecified site: Secondary | ICD-10-CM | POA: Insufficient documentation

## 2023-03-18 MED ORDER — CYCLOBENZAPRINE HCL 10 MG PO TABS: 10.0000 mg | ORAL_TABLET | Freq: Two times a day (BID) | ORAL | 0 refills | Status: AC | PRN
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
## 2023-03-18 NOTE — ED Notes (Signed)
Patient transported to X-ray 
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
## 2024-01-24 ENCOUNTER — Encounter: Payer: Self-pay | Admitting: *Deleted

## 2024-01-26 ENCOUNTER — Other Ambulatory Visit: Payer: Self-pay | Admitting: *Deleted

## 2024-01-26 ENCOUNTER — Inpatient Hospital Stay
Admission: RE | Admit: 2024-01-26 | Discharge: 2024-01-26 | Disposition: A | Discharge status: Home / Self Care | Payer: Self-pay | Source: Ambulatory Visit | Attending: *Deleted | Admitting: *Deleted

## 2024-01-26 DIAGNOSIS — Z1231 Encounter for screening mammogram for malignant neoplasm of breast: Secondary | ICD-10-CM

## 2024-01-26 DIAGNOSIS — R928 Other abnormal and inconclusive findings on diagnostic imaging of breast: Secondary | ICD-10-CM

## 2024-01-27 ENCOUNTER — Encounter: Payer: Self-pay | Admitting: Radiology

## 2024-01-27 ENCOUNTER — Ambulatory Visit
Admission: RE | Admit: 2024-01-27 | Discharge: 2024-01-27 | Disposition: A | Discharge status: Home / Self Care | Source: Ambulatory Visit | Attending: *Deleted | Admitting: *Deleted

## 2024-01-27 DIAGNOSIS — R928 Other abnormal and inconclusive findings on diagnostic imaging of breast: Secondary | ICD-10-CM | POA: Diagnosis present
